# Patient Record
Sex: Male | Born: 1995
Health system: Southern US, Community
[De-identification: ages and names within clinical notes are randomized; demographics above are authoritative.]

## PROBLEM LIST (undated history)

## (undated) DIAGNOSIS — K219 Gastro-esophageal reflux disease without esophagitis: Secondary | ICD-10-CM

## (undated) HISTORY — PX: TYMPANOSTOMY TUBE PLACEMENT: SHX32

---
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
## 2017-08-19 DIAGNOSIS — R030 Elevated blood-pressure reading, without diagnosis of hypertension: Secondary | ICD-10-CM | POA: Diagnosis not present

## 2017-08-19 DIAGNOSIS — J301 Allergic rhinitis due to pollen: Secondary | ICD-10-CM | POA: Diagnosis not present

## 2017-08-19 DIAGNOSIS — K219 Gastro-esophageal reflux disease without esophagitis: Secondary | ICD-10-CM | POA: Diagnosis not present

## 2017-08-19 DIAGNOSIS — W99XXXA Exposure to other man-made environmental factors, initial encounter: Secondary | ICD-10-CM | POA: Diagnosis not present

## 2017-08-25 DIAGNOSIS — Z68.41 Body mass index (BMI) pediatric, 5th percentile to less than 85th percentile for age: Secondary | ICD-10-CM | POA: Diagnosis not present

## 2017-08-25 DIAGNOSIS — F411 Generalized anxiety disorder: Secondary | ICD-10-CM | POA: Diagnosis not present

## 2017-09-22 DIAGNOSIS — Z1389 Encounter for screening for other disorder: Secondary | ICD-10-CM | POA: Diagnosis not present

## 2017-09-22 DIAGNOSIS — F411 Generalized anxiety disorder: Secondary | ICD-10-CM | POA: Diagnosis not present

## 2017-09-22 DIAGNOSIS — M25512 Pain in left shoulder: Secondary | ICD-10-CM | POA: Diagnosis not present

## 2017-09-22 DIAGNOSIS — Z1331 Encounter for screening for depression: Secondary | ICD-10-CM | POA: Diagnosis not present

## 2018-01-14 DIAGNOSIS — J0101 Acute recurrent maxillary sinusitis: Secondary | ICD-10-CM | POA: Diagnosis not present

## 2018-01-14 DIAGNOSIS — F411 Generalized anxiety disorder: Secondary | ICD-10-CM | POA: Diagnosis not present

## 2018-01-15 MED FILL — busPIRone HCL 10 MG TABS: 10 | 90 days supply | Qty: 180 | Fill #0

## 2018-01-15 MED FILL — OMEPRAZOLE 40 MG CPDR: 40 | 90 days supply | Qty: 90 | Fill #0

## 2018-04-01 DIAGNOSIS — Z23 Encounter for immunization: Secondary | ICD-10-CM | POA: Diagnosis not present

## 2018-04-01 DIAGNOSIS — Z68.41 Body mass index (BMI) pediatric, 5th percentile to less than 85th percentile for age: Secondary | ICD-10-CM | POA: Diagnosis not present

## 2018-04-01 DIAGNOSIS — H8110 Benign paroxysmal vertigo, unspecified ear: Secondary | ICD-10-CM | POA: Diagnosis not present

## 2018-04-14 DIAGNOSIS — H52223 Regular astigmatism, bilateral: Secondary | ICD-10-CM | POA: Diagnosis not present

## 2018-05-03 DIAGNOSIS — H6980 Other specified disorders of Eustachian tube, unspecified ear: Secondary | ICD-10-CM | POA: Diagnosis not present

## 2018-05-03 DIAGNOSIS — H8110 Benign paroxysmal vertigo, unspecified ear: Secondary | ICD-10-CM | POA: Diagnosis not present

## 2018-06-10 DIAGNOSIS — R05 Cough: Secondary | ICD-10-CM | POA: Diagnosis not present

## 2018-06-10 DIAGNOSIS — J0100 Acute maxillary sinusitis, unspecified: Secondary | ICD-10-CM | POA: Diagnosis not present

## 2018-08-04 MED FILL — busPIRone HCL 10 MG TABS: 10 | 90 days supply | Qty: 180 | Fill #0

## 2018-08-10 DIAGNOSIS — J0100 Acute maxillary sinusitis, unspecified: Secondary | ICD-10-CM | POA: Diagnosis not present

## 2018-08-10 DIAGNOSIS — J309 Allergic rhinitis, unspecified: Secondary | ICD-10-CM | POA: Diagnosis not present

## 2019-05-18 DIAGNOSIS — Z6821 Body mass index (BMI) 21.0-21.9, adult: Secondary | ICD-10-CM | POA: Diagnosis not present

## 2019-05-18 DIAGNOSIS — H109 Unspecified conjunctivitis: Secondary | ICD-10-CM | POA: Diagnosis not present

## 2019-05-25 DIAGNOSIS — Z6821 Body mass index (BMI) 21.0-21.9, adult: Secondary | ICD-10-CM | POA: Diagnosis not present

## 2019-05-25 DIAGNOSIS — M7989 Other specified soft tissue disorders: Secondary | ICD-10-CM | POA: Diagnosis not present

## 2019-08-08 DIAGNOSIS — L700 Acne vulgaris: Secondary | ICD-10-CM | POA: Diagnosis not present

## 2019-08-08 DIAGNOSIS — L218 Other seborrheic dermatitis: Secondary | ICD-10-CM | POA: Diagnosis not present

## 2019-09-22 DIAGNOSIS — J029 Acute pharyngitis, unspecified: Secondary | ICD-10-CM | POA: Diagnosis not present

## 2019-09-22 DIAGNOSIS — Z20828 Contact with and (suspected) exposure to other viral communicable diseases: Secondary | ICD-10-CM | POA: Diagnosis not present

## 2019-09-22 DIAGNOSIS — J0101 Acute recurrent maxillary sinusitis: Secondary | ICD-10-CM | POA: Diagnosis not present

## 2021-02-26 ENCOUNTER — Other Ambulatory Visit: Payer: Self-pay | Admitting: Family Medicine

## 2021-02-26 ENCOUNTER — Ambulatory Visit
Admission: RE | Admit: 2021-02-26 | Discharge: 2021-02-26 | Disposition: A | Discharge status: Home / Self Care | Source: Ambulatory Visit | Attending: Family Medicine | Admitting: Family Medicine

## 2021-02-26 DIAGNOSIS — S6991XA Unspecified injury of right wrist, hand and finger(s), initial encounter: Secondary | ICD-10-CM

## 2022-01-06 DIAGNOSIS — Z20828 Contact with and (suspected) exposure to other viral communicable diseases: Secondary | ICD-10-CM | POA: Diagnosis not present

## 2022-01-06 DIAGNOSIS — Z6823 Body mass index (BMI) 23.0-23.9, adult: Secondary | ICD-10-CM | POA: Diagnosis not present

## 2022-01-06 DIAGNOSIS — R509 Fever, unspecified: Secondary | ICD-10-CM | POA: Diagnosis not present

## 2022-03-14 DIAGNOSIS — Z23 Encounter for immunization: Secondary | ICD-10-CM | POA: Diagnosis not present

## 2022-10-02 DIAGNOSIS — Z0000 Encounter for general adult medical examination without abnormal findings: Secondary | ICD-10-CM | POA: Diagnosis not present

## 2022-10-02 DIAGNOSIS — Z6823 Body mass index (BMI) 23.0-23.9, adult: Secondary | ICD-10-CM | POA: Diagnosis not present

## 2022-10-07 ENCOUNTER — Encounter (INDEPENDENT_AMBULATORY_CARE_PROVIDER_SITE_OTHER): Payer: Self-pay | Admitting: *Deleted

## 2022-10-22 ENCOUNTER — Encounter (INDEPENDENT_AMBULATORY_CARE_PROVIDER_SITE_OTHER): Payer: Self-pay | Admitting: *Deleted

## 2022-11-03 IMAGING — CR DG FINGER MIDDLE 2+V*R*
3 series · 3 of 3 positions shown · non-contrast
Comparison: No prior images retrievable at the time of this
dictation.

CLINICAL DATA: Injury of right middle finger, initial encounter

EXAM:
RIGHT MIDDLE FINGER 2+V

[x finger pa right]
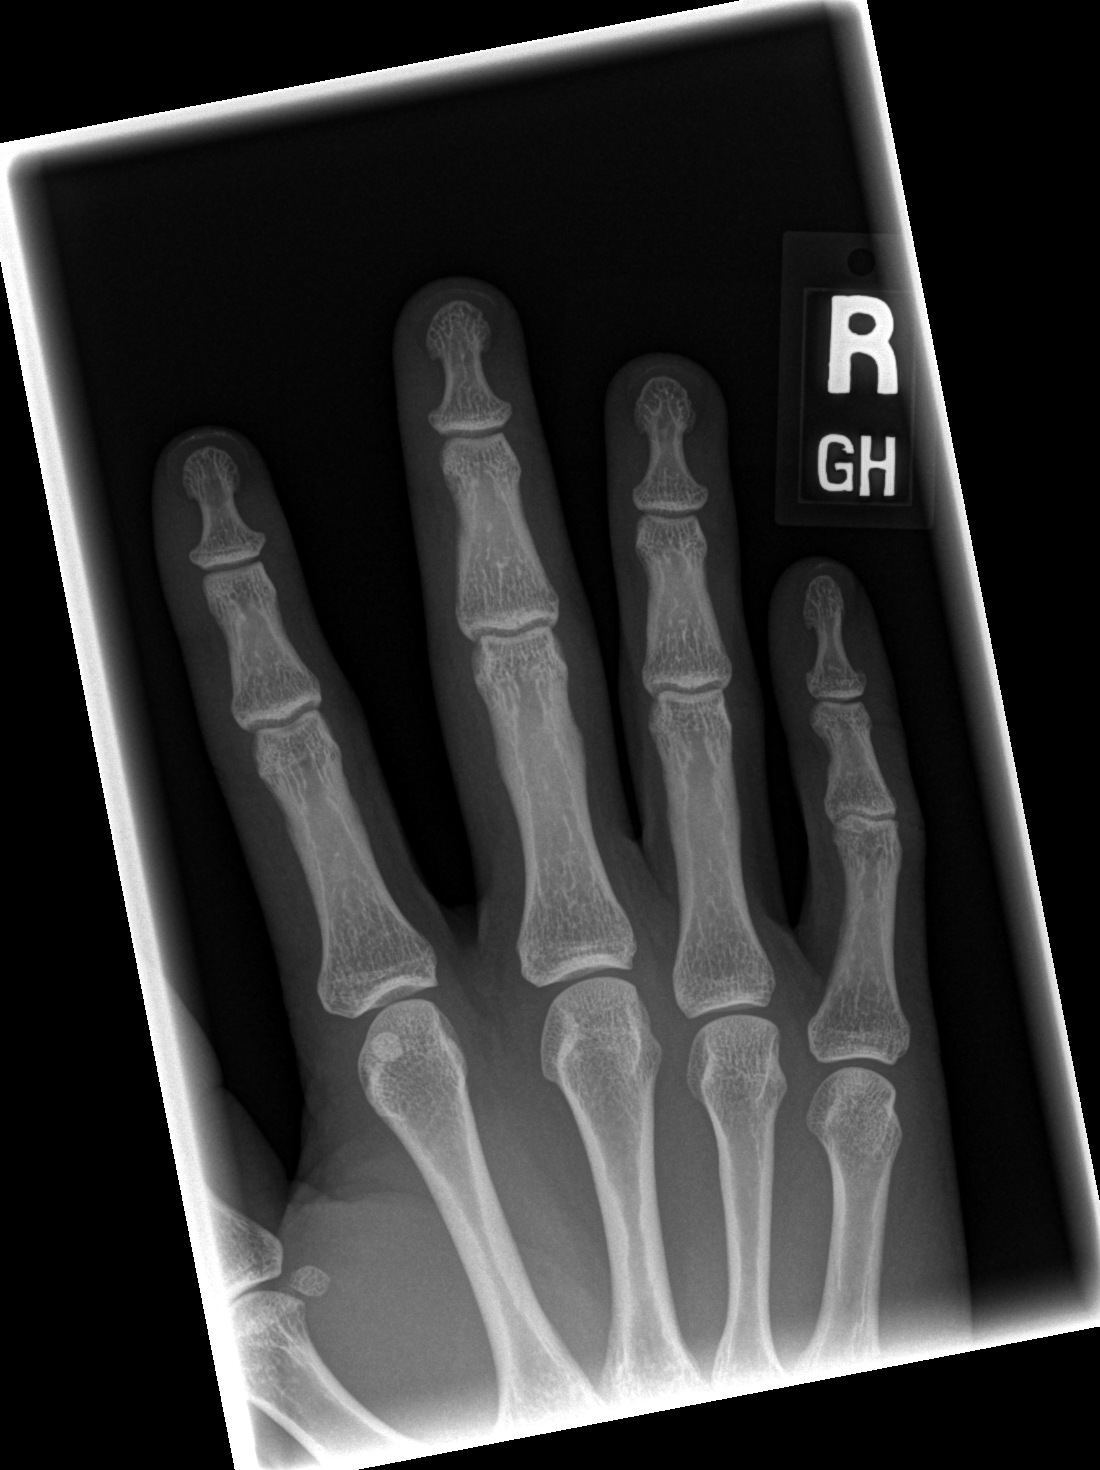

[x finger obl. right]
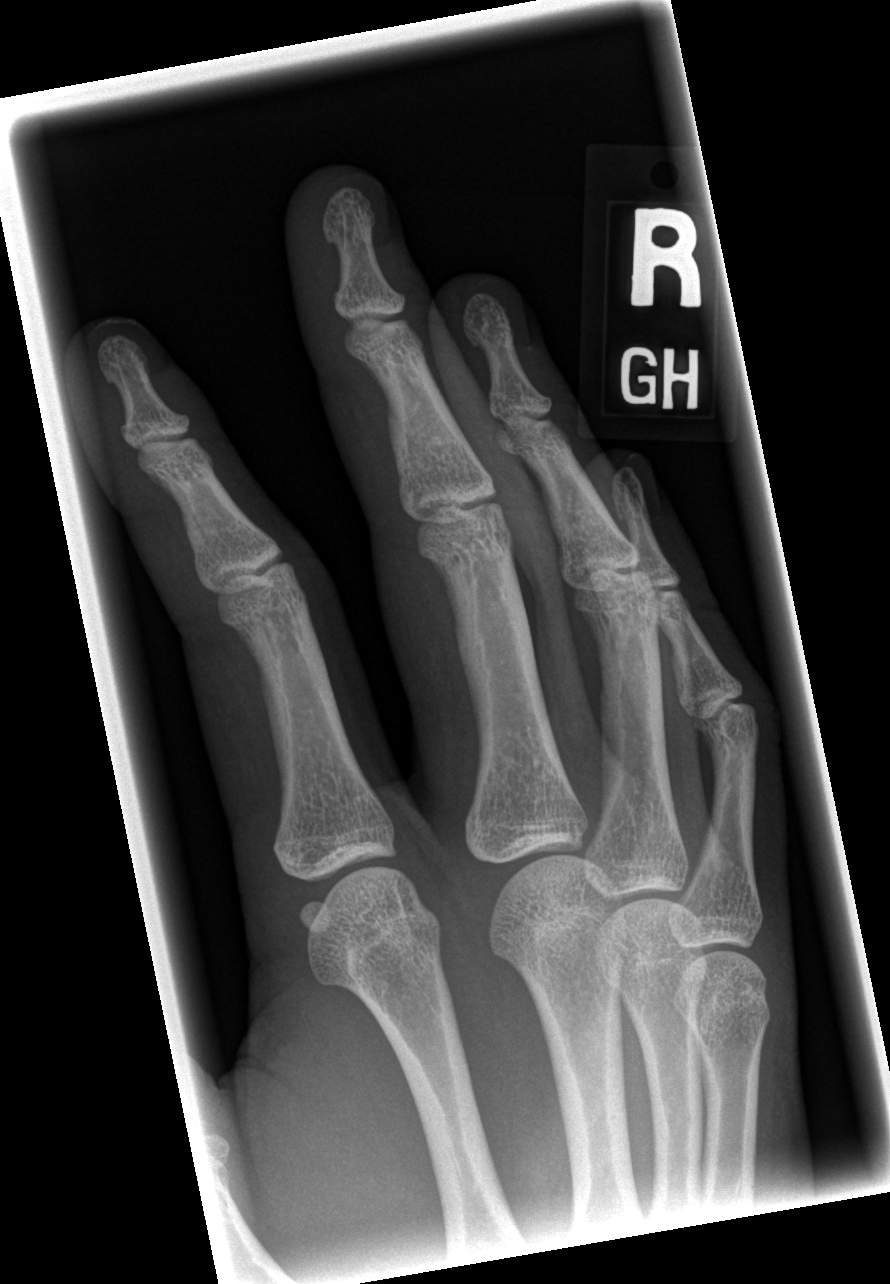

[x finger lateral right]
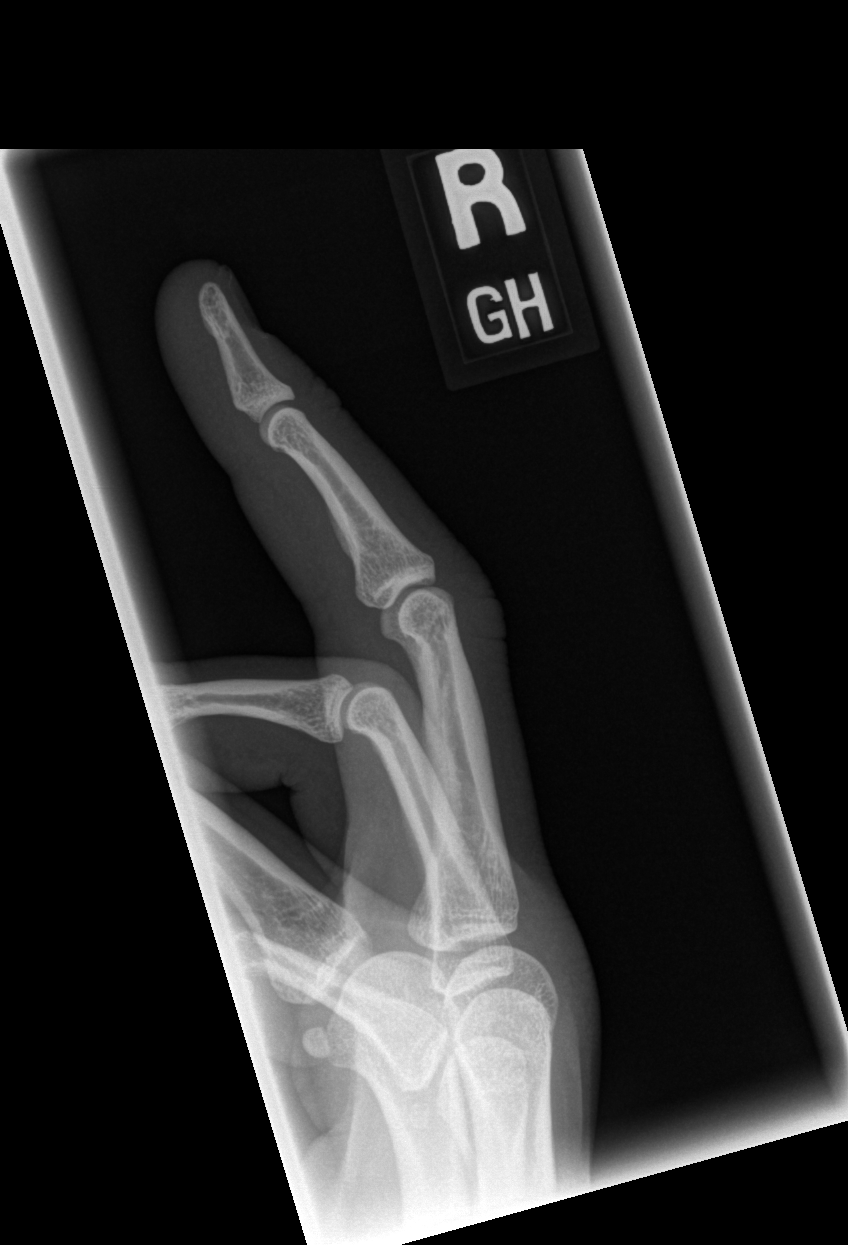

[3 of 3 positions shown; findings below may reference images not displayed]

FINDINGS: There is no evidence of acute fracture or dislocation. There is no
significant arthropathy. No radiopaque foreign body. There is mild
soft tissue swelling of the middle finger.
IMPRESSION: No acute osseous abnormality. Soft tissue swelling of the middle
finger.

## 2022-11-05 ENCOUNTER — Ambulatory Visit (INDEPENDENT_AMBULATORY_CARE_PROVIDER_SITE_OTHER): Payer: BC Managed Care – PPO | Admitting: Gastroenterology

## 2022-11-05 ENCOUNTER — Encounter (INDEPENDENT_AMBULATORY_CARE_PROVIDER_SITE_OTHER): Payer: Self-pay | Admitting: Gastroenterology

## 2022-11-05 VITALS — BP 123/71 | HR 78 | Temp 98.7°F | Ht 74.0 in | Wt 191.7 lb

## 2022-11-05 DIAGNOSIS — K219 Gastro-esophageal reflux disease without esophagitis: Secondary | ICD-10-CM | POA: Diagnosis not present

## 2022-11-05 MED ORDER — OMEPRAZOLE 40 MG PO CPDR: 40.0000 mg | DELAYED_RELEASE_CAPSULE | Freq: Every day | ORAL | 3 refills | Status: DC

## 2022-11-05 NOTE — Progress Notes (Signed)
Katrinka Blazing, M.D. Gastroenterology & Hepatology [facility] G[facility]enterology [address] [place], [place] [address] Primary Care Physician: Lawerance Sabal, [place] [address] [place] [address]  Referring MD: PCP  Chief Complaint:  Heartburn  History of Present Illness: Kendarrius Tanzi is a 27 y.o. male with no PMH, who presents for evaluation of heartburn.  Patient reports that for the last 3 months he has noticed recurrent episodes of heartburn. States he used to have GERD when younger but it resolved when he grew. He reports that he has noticed having heartburn after drinking soft drinks, or eating spicy food/tomatoes. Very occasionally he has heartburn without eating. He has presented frequented burping.  He recently (1 month ago) started taking OTC Prilosec, thinks he is taking 3 pills of the 20 mg every day. This controls his symptoms. He will have heartburn with lower dose.  No odynophagia or dysphagia.  The patient denies having any nausea, vomiting, fever, chills, hematochezia, melena, hematemesis, abdominal distention, abdominal pain, diarrhea, jaundice, pruritus or weight loss.  Last FAO:ZHYQM Last Colonoscopy:never  FHx: neg for any gastrointestinal/liver disease, grandfather colon cancer Social: neg smoking, alcohol or illicit drug use Surgical: no abdominal surgeries  Past Medical History:History reviewed. No pertinent past medical history.  Past Surgical History:History reviewed. No pertinent surgical history.  Family History: Family History  Problem Relation Age of Onset   Healthy Mother    Healthy Father     Social History: Social History   Tobacco Use  Smoking Status Never  Smokeless Tobacco Never   Social History   Substance and Sexual Activity  Alcohol Use Never   Social History   Substance and Sexual Activity  Drug Use Never    Allergies: Not on File  Medications: No current outpatient medications on file.    No current facility-administered medications for this visit.    Review of Systems: GENERAL: negative for malaise, night sweats HEENT: No changes in hearing or vision, no nose bleeds or other nasal problems. NECK: Negative for lumps, goiter, pain and significant neck swelling RESPIRATORY: Negative for cough, wheezing CARDIOVASCULAR: Negative for chest pain, leg swelling, palpitations, orthopnea GI: SEE HPI MUSCULOSKELETAL: Negative for joint pain or swelling, back pain, and muscle pain. SKIN: Negative for lesions, rash PSYCH: Negative for sleep disturbance, mood disorder and recent psychosocial stressors. HEMATOLOGY Negative for prolonged bleeding, bruising easily, and swollen nodes. ENDOCRINE: Negative for cold or heat intolerance, polyuria, polydipsia and goiter. NEURO: negative for tremor, gait imbalance, syncope and seizures. The remainder of the review of systems is noncontributory.   Physical Exam: BP 123/71 (BP Location: Left Arm, Patient Position: Sitting, Cuff Size: Normal)   Pulse 78   Temp 98.7 F (37.1 C) (Temporal)   Ht 6\' 2"  (1.88 m)   Wt 191 lb 11.2 oz (87 kg)   BMI 24.61 kg/m  GENERAL: The patient is AO x3, in no acute distress. HEENT: Head is normocephalic and atraumatic. EOMI are intact. Mouth is well hydrated and without lesions. NECK: Supple. No masses LUNGS: Clear to auscultation. No presence of rhonchi/wheezing/rales. Adequate chest expansion HEART: RRR, normal s1 and s2. ABDOMEN: Soft, nontender, no guarding, no peritoneal signs, and nondistended. BS +. No masses. EXTREMITIES: Without any cyanosis, clubbing, rash, lesions or edema. NEUROLOGIC: AOx3, no focal motor deficit. SKIN: no jaundice, no rashes   Imaging/Labs: as above  I personally reviewed and interpreted the available labs, imaging and endoscopic files.  Impression and Plan: Mariel Gaudin is a 27 y.o. male  with no PMH, who presents for evaluation of heartburn.  The patient has presented  very typical symptoms of GERD without red flag signs.  He has responded to very high doses of PPI.  We discussed that ideally he should take a max of 40 mg per dose but he should change the timing when he consumes the PPI.  He understood this.  If he is still presenting significant heartburn with once a day dosing, he can take from rescue doses of Pepcid.  With this is that if his symptoms were to persist he will need to proceed with an EGD to further evaluate this.  We also briefly discussed the possibility of TIF as an option to treat refractory reflux or to avoid taking the medication long-term.  - Start omeprazole 40 mg qday -Explained presumed etiology of reflux symptoms. Instruction provided in the use of antireflux medication - patient should take medication in the morning 45 minutes before eating breakfast. -Discussed avoidance of eating within 2 hours of lying down to sleep and benefit of blocks to elevate head of bed. Also, will benefit from avoiding carbonated drinks/sodas or food that has tomatoes, spicy or greasy food. -If not improving with medication after a couple of weeks, can take Pepcid 20 mg at bedtime -The patient and I held a thorough discussion about potential nonpharmacologic treatments for reflux such as transoral Incisionless Fundoplication (TIF).  The details of the procedure, benefits and risks, as well as prognosis with this intervention was thoroughly discussed with the patient who understood and agreed.   The patient will read more about this procedure  All questions were answered.      Katrinka Blazing, MD Gastroenterology and Hepatology [facility]

## 2022-11-05 NOTE — Patient Instructions (Signed)
Start omeprazole 40 mg qday Explained presumed etiology of reflux symptoms. Instruction provided in the use of antireflux medication - patient should take medication in the morning 45 minutes before eating breakfast. Discussed avoidance of eating within 2 hours of lying down to sleep and benefit of blocks to elevate head of bed. Also, will benefit from avoiding carbonated drinks/sodas or food that has tomatoes, spicy or greasy food. If not improving with medication after a couple of weeks, can take Pepcid 20 mg at bedtime The patient and I held a thorough discussion about potential nonpharmacologic treatments for reflux such as transoral Incisionless Fundoplication (TIF).  The details of the procedure, benefits and risks, as well as prognosis with this intervention was thoroughly discussed with the patient who understood and agreed.  Dietary modifications and post procedural recommendations were also discussed the patient.  The patient will read more about this procedure

## 2022-11-07 DIAGNOSIS — K219 Gastro-esophageal reflux disease without esophagitis: Secondary | ICD-10-CM | POA: Diagnosis not present

## 2022-11-07 DIAGNOSIS — Z0000 Encounter for general adult medical examination without abnormal findings: Secondary | ICD-10-CM | POA: Diagnosis not present

## 2022-11-07 DIAGNOSIS — Z13220 Encounter for screening for lipoid disorders: Secondary | ICD-10-CM | POA: Diagnosis not present

## 2022-12-04 ENCOUNTER — Ambulatory Visit (INDEPENDENT_AMBULATORY_CARE_PROVIDER_SITE_OTHER): Payer: BC Managed Care – PPO | Admitting: Gastroenterology

## 2023-01-26 ENCOUNTER — Ambulatory Visit (INDEPENDENT_AMBULATORY_CARE_PROVIDER_SITE_OTHER): Payer: BC Managed Care – PPO | Admitting: Gastroenterology

## 2023-03-16 ENCOUNTER — Encounter (INDEPENDENT_AMBULATORY_CARE_PROVIDER_SITE_OTHER): Payer: Self-pay | Admitting: Gastroenterology

## 2023-03-16 ENCOUNTER — Ambulatory Visit (INDEPENDENT_AMBULATORY_CARE_PROVIDER_SITE_OTHER): Payer: BC Managed Care – PPO | Admitting: Gastroenterology

## 2023-03-16 VITALS — BP 128/69 | HR 72 | Temp 98.2°F | Ht 74.0 in | Wt 186.6 lb

## 2023-03-16 DIAGNOSIS — K219 Gastro-esophageal reflux disease without esophagitis: Secondary | ICD-10-CM | POA: Diagnosis not present

## 2023-03-16 NOTE — Progress Notes (Signed)
Referring Provider: Lawerance Sabal, PA Primary Care Physician:  Lawerance Sabal, [place] Primary GI Physician: Dr. Levon Hedger   Chief Complaint  Patient presents with   Gastroesophageal Reflux    Follow up on GERD. Taking omeprazole once daily and states it is helping a lot.    HPI:   Luis Steele is a 27 y.o. male with history of GERD   Patient presenting today for follow up of GERD   Last seen June 2024, at that time reported for the last 3 months having recurrent episodes of heartburn.  Reported history of GERD as a young child that he outgrew.  Also with frequent belching.  Had recently started over-the-counter Prilosec taking 3 pills of the 20 mg Prilosec every day.  Patient recommended to start omeprazole 40 mg daily, good reflux precautions, add Pepcid 20 mg at bedtime if symptoms not improving over the next couple of weeks, also given information about TIF procedure.  Present: Doing well today. No GI complaints. No issues with GERD symptoms since starting omeprazole 40mg  daily, he is feeling much better on this. No red flag symptoms. Patient denies melena, hematochezia, nausea, vomiting, diarrhea, constipation, dysphagia, odyonophagia, early satiety or weight loss.    Last Colonoscopy: never  Last Endoscopy: never  Recommendations:    Current Outpatient Medications  Medication Sig Dispense Refill   omeprazole (PRILOSEC) 40 MG capsule Take 1 capsule (40 mg total) by mouth daily. 90 capsule 3   No current facility-administered medications for this visit.   Allergies as of 03/16/2023   (No Known Allergies)   Family History  Problem Relation Age of Onset   Healthy Mother    Healthy Father     Social History   Socioeconomic History   Marital status: Single    Spouse name: Not on file   Number of children: Not on file   Years of education: Not on file   Highest education level: Not on file  Occupational History   Not on file  Tobacco Use   Smoking status: Never    Smokeless tobacco: Never  Vaping Use   Vaping status: Never Used  Substance and Sexual Activity   Alcohol use: Never   Drug use: Never   Sexual activity: Not on file  Other Topics Concern   Not on file  Social History Narrative   Not on file   Social Determinants of Health   Financial Resource Strain: Not on file  Food Insecurity: Not on file  Transportation Needs: Not on file  Physical Activity: Not on file  Stress: Not on file  Social Connections: Not on file    Review of systems General: negative for malaise, night sweats, fever, chills, weight los Neck: Negative for lumps, goiter, pain and significant neck swelling Resp: Negative for cough, wheezing, dyspnea at rest CV: Negative for chest pain, leg swelling, palpitations, orthopnea GI: denies melena, hematochezia, nausea, vomiting, diarrhea, constipation, dysphagia, odyonophagia, early satiety or unintentional weight loss.  The remainder of the review of systems is noncontributory.  Physical Exam: BP 128/69 (BP Location: Left Arm, Patient Position: Sitting, Cuff Size: Normal)   Pulse 72   Temp 98.2 F (36.8 C) (Oral)   Ht 6\' 2"  (1.88 m)   Wt 186 lb 9.6 oz (84.6 kg)   BMI 23.96 kg/m  General:   Alert and oriented. No distress noted. Pleasant and cooperative.  Head:  Normocephalic and atraumatic. Eyes:  Conjuctiva clear without scleral icterus. Mouth:  Oral mucosa pink and moist. Good dentition.  No lesions. Heart: Normal rate and rhythm, s1 and s2 heart sounds present.  Lungs: Clear lung sounds in all lobes. Respirations equal and unlabored. Abdomen:  +BS, soft, non-tender and non-distended. No rebound or guarding. No HSM or masses noted. Neurologic:  Alert and  oriented x4 Psych:  Alert and cooperative. Normal mood and affect.  Invalid input(s): "6 MONTHS"   ASSESSMENT: Luis Steele is a 27 y.o. male presenting today for follow up of GERD  GERD well-managed on omeprazole 40 mg once daily.  He denies any  breakthrough symptoms.  No dysphagia or odynophagia.  He has no GI complaints today.  Will continue with omeprazole 40 mg once daily, good reflux precautions to include avoiding greasy, spicy, tomato/citrus based foods, caffeine, chocolate, alcohol, staying upright 2 to 3 hours after eating prior to laying down.   PLAN:  Continue with omeprazole 40mg  daily   2. Good reflux precautions   All questions were answered, patient verbalized understanding and is in agreement with plan as outlined above.   Follow Up: 1 year   Luis Ricci L. Jeanmarie Hubert, MSN, APRN, AGNP-C Adult-Gerontology Nurse Practitioner [facility]  I have reviewed the note and agree with the APP's assessment as described in this progress note  Katrinka Blazing, MD Gastroenterology and Hepatology [facility]

## 2023-03-16 NOTE — Patient Instructions (Signed)
Please continue with omeprazole 40mg  once daily Be mindful of greasy, spicy, fried, citrus foods, caffeine, carbonated drinks, chocolate and alcohol as these can increase reflux symptoms Stay upright 2-3 hours after eating, prior to lying down and avoid eating late in the evenings.  Follow up 1 year or sooner for new or worsening GI issues  It was a pleasure to see you today. I want to create trusting relationships with patients and provide genuine, compassionate, and quality care. I truly value your feedback! please be on the lookout for a survey regarding your visit with me today. I appreciate your input about our visit and your time in completing this!    Beaulah Romanek L. Jeanmarie Hubert, MSN, APRN, AGNP-C Adult-Gerontology Nurse Practitioner [facility] at [facility]

## 2023-06-12 DIAGNOSIS — J0100 Acute maxillary sinusitis, unspecified: Secondary | ICD-10-CM | POA: Diagnosis not present

## 2023-06-12 DIAGNOSIS — Z6824 Body mass index (BMI) 24.0-24.9, adult: Secondary | ICD-10-CM | POA: Diagnosis not present

## 2023-06-12 DIAGNOSIS — J069 Acute upper respiratory infection, unspecified: Secondary | ICD-10-CM | POA: Diagnosis not present

## 2023-06-12 DIAGNOSIS — Z20828 Contact with and (suspected) exposure to other viral communicable diseases: Secondary | ICD-10-CM | POA: Diagnosis not present

## 2023-06-12 DIAGNOSIS — R509 Fever, unspecified: Secondary | ICD-10-CM | POA: Diagnosis not present

## 2023-09-09 ENCOUNTER — Other Ambulatory Visit (INDEPENDENT_AMBULATORY_CARE_PROVIDER_SITE_OTHER): Payer: Self-pay | Admitting: Gastroenterology

## 2023-09-09 DIAGNOSIS — K649 Unspecified hemorrhoids: Secondary | ICD-10-CM

## 2023-09-09 MED ORDER — HYDROCORTISONE ACETATE 25 MG RE SUPP: 25.0000 mg | Freq: Two times a day (BID) | RECTAL | 0 refills | Status: DC

## 2023-09-09 NOTE — Progress Notes (Signed)
 Patient with new onset recent rectal bleeding, will send Anusol suppositories

## 2023-12-31 ENCOUNTER — Encounter (INDEPENDENT_AMBULATORY_CARE_PROVIDER_SITE_OTHER): Payer: Self-pay | Admitting: Gastroenterology

## 2023-12-31 ENCOUNTER — Ambulatory Visit (INDEPENDENT_AMBULATORY_CARE_PROVIDER_SITE_OTHER): Admitting: Gastroenterology

## 2023-12-31 VITALS — BP 124/69 | HR 83 | Temp 98.9°F | Ht 74.0 in | Wt 183.3 lb

## 2023-12-31 DIAGNOSIS — R197 Diarrhea, unspecified: Secondary | ICD-10-CM | POA: Insufficient documentation

## 2023-12-31 DIAGNOSIS — K219 Gastro-esophageal reflux disease without esophagitis: Secondary | ICD-10-CM | POA: Diagnosis not present

## 2023-12-31 DIAGNOSIS — R194 Change in bowel habit: Secondary | ICD-10-CM

## 2023-12-31 DIAGNOSIS — K625 Hemorrhage of anus and rectum: Secondary | ICD-10-CM | POA: Diagnosis not present

## 2023-12-31 DIAGNOSIS — K580 Irritable bowel syndrome with diarrhea: Secondary | ICD-10-CM | POA: Insufficient documentation

## 2023-12-31 NOTE — H&P (View-Only) (Signed)
 Toribio Fortune, M.D. Gastroenterology & Hepatology [facility] Gastroenterology [address] [place], [place] [address]  Primary Care Physician: Job Bolt, [place] [address] [place] [address]  I will communicate my assessment and recommendations to the referring MD via EMR.  Problems: Diarrhea GERD  History of Present Illness: Luis Steele is a 28 y.o. male with past medical history of GERD, who presents for follow up of GERD and diarrhea.  The patient was last seen on 03/16/2023. At that time, the patient was continued on omeprazole  40 mg daily.  Patient complained of having rectal bleeding in April and was given a Anusol  course.  Patient reports that for the last 3 months he has presented episodes of postprandial diarrhea. Consistency can vary, can be soft to watery. He is having 3-4 Bms per day, usually after having a meal. Has only noticed coffee causes increased BM frequency. No other identified food. He has seen scant amount of blood when he wipes, but is not often. He may have some pain episodes in the epigastric area, but this is not often.   The patient denies having any nausea, vomiting, fever, chills, melena, hematemesis, abdominal distention, jaundice, pruritus or weight loss.  He is taking omeprazole  40 mg daily, no heartburn.  Last EGD: Never Last Colonoscopy: Never  Past Medical History:History reviewed. No pertinent past medical history.  Past Surgical History:History reviewed. No pertinent surgical history.  Family History: Family History  Problem Relation Age of Onset   Healthy Mother    Healthy Father     Social History: Social History   Tobacco Use  Smoking Status Never  Smokeless Tobacco Never   Social History   Substance and Sexual Activity  Alcohol  Use Never   Social History   Substance and Sexual Activity  Drug Use Never    Allergies: No Known Allergies  Medications: Current Outpatient Medications   Medication Sig Dispense Refill   omeprazole  (PRILOSEC) 40 MG capsule Take 1 capsule (40 mg total) by mouth daily. 90 capsule 3   Peppermint Oil (IBGARD PO) Take by mouth daily at 6 (six) AM.     hydrocortisone  (ANUSOL -HC) 25 MG suppository Place 1 suppository (25 mg total) rectally 2 (two) times daily. (Patient not taking: Reported on 12/31/2023) 14 suppository 0   No current facility-administered medications for this visit.    Review of Systems: GENERAL: negative for malaise, night sweats HEENT: No changes in hearing or vision, no nose bleeds or other nasal problems. NECK: Negative for lumps, goiter, pain and significant neck swelling RESPIRATORY: Negative for cough, wheezing CARDIOVASCULAR: Negative for chest pain, leg swelling, palpitations, orthopnea GI: SEE HPI MUSCULOSKELETAL: Negative for joint pain or swelling, back pain, and muscle pain. SKIN: Negative for lesions, rash PSYCH: Negative for sleep disturbance, mood disorder and recent psychosocial stressors. HEMATOLOGY Negative for prolonged bleeding, bruising easily, and swollen nodes. ENDOCRINE: Negative for cold or heat intolerance, polyuria, polydipsia and goiter. NEURO: negative for tremor, gait imbalance, syncope and seizures. The remainder of the review of systems is noncontributory.   Physical Exam: BP 124/69 (BP Location: Left Arm, Patient Position: Sitting, Cuff Size: Normal)   Pulse 83   Temp 98.9 F (37.2 C) (Temporal)   Ht 6' 2 (1.88 m)   Wt 183 lb 4.8 oz (83.1 kg)   BMI 23.53 kg/m  GENERAL: The patient is AO x3, in no acute distress. HEENT: Head is normocephalic and atraumatic. EOMI are intact. Mouth is well hydrated and without lesions. NECK:  Supple. No masses LUNGS: Clear to auscultation. No presence of rhonchi/wheezing/rales. Adequate chest expansion HEART: RRR, normal s1 and s2. ABDOMEN: Soft, nontender, no guarding, no peritoneal signs, and nondistended. BS +. No masses. EXTREMITIES: Without any  cyanosis, clubbing, rash, lesions or edema. NEUROLOGIC: AOx3, no focal motor deficit. SKIN: no jaundice, no rashes  Imaging/Labs: as above  I personally reviewed and interpreted the available labs, imaging and endoscopic files.  Impression and Plan: History of Present Illness: Luis Steele is a 28 y.o. male with past medical history of GERD, who presents for follow up of GERD and diarrhea.  Patient has presented new onset of episodes of postprandial diarrhea with very occasional rectal bleeding, especially when he wipes.  No other red flag signs.  We discussed the importance of performing further testing to rule out infectious etiologies and autoimmune disorders.  He is in agreement to proceed with this.  May consider starting antidiarrheals once testing is back.  He may also have a component of bowel hypersensitivity leading to his current symptoms, for which he will benefit from implementing a low FODMAP diet.  Ultimately, if his symptoms persist, he will need to have a colonoscopy.  GERD appears to be well-controlled with omeprazole  40 mg daily.  Will continue this dosage for now but will decrease to 20 mg dosing once he runs out of his current prescription.  -Check CMP celiac disease panel and TSH - Check fecal calprotectin, C. Diff, GI path and ova and parasite in stool -Try to implement a low FODMAP diet -Avoid antidiarrheals for now until stool testing is back -Continue omeprazole  40 mg daily for now, once he is running out of medication he should call to our office to decrease dosing to 20 mg daily  All questions were answered.      Toribio Fortune, MD Gastroenterology and Hepatology [facility]enterology

## 2023-12-31 NOTE — Progress Notes (Signed)
 Toribio Fortune, M.D. Gastroenterology & Hepatology [facility] Gastroenterology [address] [place], [place] [address]  Primary Care Physician: Job Bolt, [place] [address] [place] [address]  I will communicate my assessment and recommendations to the referring MD via EMR.  Problems: Diarrhea GERD  History of Present Illness: Luis Steele is a 28 y.o. male with past medical history of GERD, who presents for follow up of GERD and diarrhea.  The patient was last seen on 03/16/2023. At that time, the patient was continued on omeprazole  40 mg daily.  Patient complained of having rectal bleeding in April and was given a Anusol  course.  Patient reports that for the last 3 months he has presented episodes of postprandial diarrhea. Consistency can vary, can be soft to watery. He is having 3-4 Bms per day, usually after having a meal. Has only noticed coffee causes increased BM frequency. No other identified food. He has seen scant amount of blood when he wipes, but is not often. He may have some pain episodes in the epigastric area, but this is not often.   The patient denies having any nausea, vomiting, fever, chills, melena, hematemesis, abdominal distention, jaundice, pruritus or weight loss.  He is taking omeprazole  40 mg daily, no heartburn.  Last EGD: Never Last Colonoscopy: Never  Past Medical History:History reviewed. No pertinent past medical history.  Past Surgical History:History reviewed. No pertinent surgical history.  Family History: Family History  Problem Relation Age of Onset   Healthy Mother    Healthy Father     Social History: Social History   Tobacco Use  Smoking Status Never  Smokeless Tobacco Never   Social History   Substance and Sexual Activity  Alcohol  Use Never   Social History   Substance and Sexual Activity  Drug Use Never    Allergies: No Known Allergies  Medications: Current Outpatient Medications   Medication Sig Dispense Refill   omeprazole  (PRILOSEC) 40 MG capsule Take 1 capsule (40 mg total) by mouth daily. 90 capsule 3   Peppermint Oil (IBGARD PO) Take by mouth daily at 6 (six) AM.     hydrocortisone  (ANUSOL -HC) 25 MG suppository Place 1 suppository (25 mg total) rectally 2 (two) times daily. (Patient not taking: Reported on 12/31/2023) 14 suppository 0   No current facility-administered medications for this visit.    Review of Systems: GENERAL: negative for malaise, night sweats HEENT: No changes in hearing or vision, no nose bleeds or other nasal problems. NECK: Negative for lumps, goiter, pain and significant neck swelling RESPIRATORY: Negative for cough, wheezing CARDIOVASCULAR: Negative for chest pain, leg swelling, palpitations, orthopnea GI: SEE HPI MUSCULOSKELETAL: Negative for joint pain or swelling, back pain, and muscle pain. SKIN: Negative for lesions, rash PSYCH: Negative for sleep disturbance, mood disorder and recent psychosocial stressors. HEMATOLOGY Negative for prolonged bleeding, bruising easily, and swollen nodes. ENDOCRINE: Negative for cold or heat intolerance, polyuria, polydipsia and goiter. NEURO: negative for tremor, gait imbalance, syncope and seizures. The remainder of the review of systems is noncontributory.   Physical Exam: BP 124/69 (BP Location: Left Arm, Patient Position: Sitting, Cuff Size: Normal)   Pulse 83   Temp 98.9 F (37.2 C) (Temporal)   Ht 6' 2 (1.88 m)   Wt 183 lb 4.8 oz (83.1 kg)   BMI 23.53 kg/m  GENERAL: The patient is AO x3, in no acute distress. HEENT: Head is normocephalic and atraumatic. EOMI are intact. Mouth is well hydrated and without lesions. NECK:  Supple. No masses LUNGS: Clear to auscultation. No presence of rhonchi/wheezing/rales. Adequate chest expansion HEART: RRR, normal s1 and s2. ABDOMEN: Soft, nontender, no guarding, no peritoneal signs, and nondistended. BS +. No masses. EXTREMITIES: Without any  cyanosis, clubbing, rash, lesions or edema. NEUROLOGIC: AOx3, no focal motor deficit. SKIN: no jaundice, no rashes  Imaging/Labs: as above  I personally reviewed and interpreted the available labs, imaging and endoscopic files.  Impression and Plan: History of Present Illness: Luis Steele is a 28 y.o. male with past medical history of GERD, who presents for follow up of GERD and diarrhea.  Patient has presented new onset of episodes of postprandial diarrhea with very occasional rectal bleeding, especially when he wipes.  No other red flag signs.  We discussed the importance of performing further testing to rule out infectious etiologies and autoimmune disorders.  He is in agreement to proceed with this.  May consider starting antidiarrheals once testing is back.  He may also have a component of bowel hypersensitivity leading to his current symptoms, for which he will benefit from implementing a low FODMAP diet.  Ultimately, if his symptoms persist, he will need to have a colonoscopy.  GERD appears to be well-controlled with omeprazole  40 mg daily.  Will continue this dosage for now but will decrease to 20 mg dosing once he runs out of his current prescription.  -Check CMP celiac disease panel and TSH - Check fecal calprotectin, C. Diff, GI path and ova and parasite in stool -Try to implement a low FODMAP diet -Avoid antidiarrheals for now until stool testing is back -Continue omeprazole  40 mg daily for now, once he is running out of medication he should call to our office to decrease dosing to 20 mg daily  All questions were answered.      Toribio Fortune, MD Gastroenterology and Hepatology [facility] Gastroenterology

## 2023-12-31 NOTE — Patient Instructions (Addendum)
 Perform blood and stool workup Try to implement a low FODMAP diet Avoid antidiarrheals for now until stool testing is back Continue omeprazole  40 mg daily for now, once you are running out of your medication please call to our office to decrease dosing to 20 mg daily

## 2024-01-01 DIAGNOSIS — R197 Diarrhea, unspecified: Secondary | ICD-10-CM | POA: Diagnosis not present

## 2024-01-02 LAB — COMPREHENSIVE METABOLIC PANEL WITH GFR
AG Ratio: 2 (calc) (ref 1.0–2.5)
ALT: 14 U/L (ref 9–46)
AST: 14 U/L (ref 10–40)
Albumin: 4.7 g/dL (ref 3.6–5.1)
Alkaline phosphatase (APISO): 58 U/L (ref 36–130)
BUN: 13 mg/dL (ref 7–25)
CO2: 25 mmol/L (ref 20–32)
Calcium: 9.7 mg/dL (ref 8.6–10.3)
Chloride: 107 mmol/L (ref 98–110)
Creat: 1.06 mg/dL (ref 0.60–1.24)
Globulin: 2.4 g/dL (ref 1.9–3.7)
Glucose, Bld: 88 mg/dL (ref 65–99)
Potassium: 5 mmol/L (ref 3.5–5.3)
Sodium: 141 mmol/L (ref 135–146)
Total Bilirubin: 0.4 mg/dL (ref 0.2–1.2)
Total Protein: 7.1 g/dL (ref 6.1–8.1)
eGFR: 99 mL/min/1.73m2 (ref >=60)

## 2024-01-02 LAB — C-REACTIVE PROTEIN: CRP: <3 mg/L (ref <8.0)

## 2024-01-02 LAB — CELIAC DISEASE PANEL
(tTG) Ab, IgA: <1 U/mL
(tTG) Ab, IgG: <1 U/mL
Gliadin IgA: <1 U/mL
Gliadin IgG: 11.9 U/mL
Immunoglobulin A: 141 mg/dL (ref 47–310)

## 2024-01-02 LAB — TSH: TSH: 1.87 m[IU]/L (ref 0.40–4.50)

## 2024-01-07 LAB — GASTROINTESTINAL PATHOGEN PNL
CampyloBacter Group: NOT DETECTED
Norovirus GI/GII: NOT DETECTED
Rotavirus A: NOT DETECTED
Salmonella species: NOT DETECTED
Shiga Toxin 1: NOT DETECTED
Shiga Toxin 2: NOT DETECTED
Shigella Species: NOT DETECTED
Vibrio Group: NOT DETECTED
Yersinia enterocolitica: NOT DETECTED

## 2024-01-07 LAB — OVA AND PARASITE EXAMINATION
CONCENTRATE RESULT:: NONE SEEN
MICRO NUMBER:: 16872207
SPECIMEN QUALITY:: ADEQUATE
TRICHROME RESULT:: NONE SEEN

## 2024-01-07 LAB — CALPROTECTIN: Calprotectin: 35 ug/g

## 2024-01-08 ENCOUNTER — Ambulatory Visit: Payer: Self-pay | Admitting: Gastroenterology

## 2024-01-13 ENCOUNTER — Encounter: Payer: Self-pay | Admitting: *Deleted

## 2024-01-13 ENCOUNTER — Other Ambulatory Visit: Payer: Self-pay | Admitting: *Deleted

## 2024-01-13 MED ORDER — PEG 3350-KCL-NA BICARB-NACL 420 G PO SOLR: 4000.0000 mL | Freq: Once | ORAL | 0 refills | Status: AC

## 2024-01-18 ENCOUNTER — Other Ambulatory Visit: Payer: Self-pay

## 2024-01-18 ENCOUNTER — Encounter (HOSPITAL_COMMUNITY): Payer: Self-pay

## 2024-01-27 ENCOUNTER — Encounter (HOSPITAL_COMMUNITY)
Admission: RE | Admit: 2024-01-27 | Discharge: 2024-01-27 | Disposition: A | Discharge status: Home / Self Care | Source: Ambulatory Visit | Attending: Gastroenterology | Admitting: Gastroenterology

## 2024-01-27 ENCOUNTER — Encounter (HOSPITAL_COMMUNITY): Payer: Self-pay

## 2024-01-27 HISTORY — DX: Gastro-esophageal reflux disease without esophagitis: K21.9

## 2024-01-27 NOTE — Patient Instructions (Signed)
 Luis Steele  01/27/2024     @PREFPERIOPPHARMACY @   Your procedure is scheduled on 01/29/2024.   Report to Luis Steele at 6:45 A.M.   Call this number if you have problems the morning of surgery:   [phone]  If you experience any cold or flu symptoms such as cough, fever, chills, shortness of breath, etc. between now and your scheduled surgery, please notify us  at the above number.   Remember:   Do not eat or drink after midnight.       Take these medicines the morning of surgery with A SIP OF WATER : Omeprazole     Do not wear jewelry, make-up or nail polish, including gel polish,  artificial nails, or any other type of covering on natural nails (fingers and  toes).  Do not wear lotions, powders, or perfumes, or deodorant.  Do not shave 48 hours prior to surgery.  Men may shave face and neck.  Do not bring valuables to the hospital.  [facility] is not responsible for any belongings or valuables.  Contacts, dentures or bridgework may not be worn into surgery.  Leave your suitcase in the car.  After surgery it may be brought to your room.  For patients admitted to the hospital, discharge time will be determined by your treatment team.  Patients discharged the day of surgery will not be allowed to drive home.   Name and phone number of your driver:   Family  Special instructions:  N/A  Please read over the following fact sheets that you were given. Care and Recovery After Surgery   Colonoscopy, Adult A colonoscopy is a procedure to look at the entire large intestine. This procedure is done using a long, thin, flexible tube that has a camera on the end. You may have a colonoscopy: As a part of normal colorectal screening. If you have certain symptoms, such as: A low number of red blood cells in your blood (anemia). Diarrhea that does not go away. Pain in your abdomen. Blood in your stool. A colonoscopy can help screen for and diagnose medical problems,  including: An abnormal growth of cells or tissue (tumor). Abnormal growths within the lining of your intestine (polyps). Inflammation. Areas of bleeding. Tell your health care provider about: Any allergies you have. All medicines you are taking, including vitamins, herbs, eye drops, creams, and over-the-counter medicines. Any problems you or family members have had with anesthetic medicines. Any bleeding problems you have. Any surgeries you have had. Any medical conditions you have. Any problems you have had with having bowel movements. Whether you are pregnant or may be pregnant. What are the risks? Generally, this is a safe procedure. However, problems may occur, including: Bleeding. Damage to your intestine. Allergic reactions to medicines given during the procedure. Infection. This is rare. What happens before the procedure? Eating and drinking restrictions Follow instructions from your health care provider about eating or drinking restrictions, which may include: A few days before the procedure: Follow a low-fiber diet. Avoid nuts, seeds, dried fruit, raw fruits, and vegetables. 1-3 days before the procedure: Eat only gelatin dessert or ice pops. Drink only clear liquids, such as water, clear juice, clear broth or bouillon, black coffee or tea, or clear soft drinks or sports drinks. Avoid liquids that contain red or purple dye. The day of the procedure: Do not eat solid foods. You may continue to drink clear liquids until up to 2 hours before the procedure. Do not eat or drink  anything starting 2 hours before the procedure, or within the time period that your health care provider recommends. Bowel prep If you were prescribed a bowel prep to take by mouth (orally) to clean out your colon: Take it as told by your health care provider. Starting the day before your procedure, you will need to drink a large amount of liquid medicine. The liquid will cause you to have many bowel  movements of loose stool until your stool becomes almost clear or light green. If your skin or the opening between the buttocks (anus) gets irritated from diarrhea, you may relieve the irritation using: Wipes with medicine in them, such as adult wet wipes with aloe and vitamin E. A product to soothe skin, such as petroleum jelly. If you vomit while drinking the bowel prep: Take a break for up to 60 minutes. Begin the bowel prep again. Call your health care provider if you keep vomiting or you cannot take the bowel prep without vomiting. To clean out your colon, you may also be given: Laxative medicines. These help you have a bowel movement. Instructions for enema use. An enema is liquid medicine injected into your rectum. Medicines Ask your health care provider about: Changing or stopping your regular medicines or supplements. This is especially important if you are taking iron supplements, diabetes medicines, or blood thinners. Taking medicines such as aspirin and ibuprofen. These medicines can thin your blood. Do not take these medicines unless your health care provider tells you to take them. Taking over-the-counter medicines, vitamins, herbs, and supplements. General instructions Ask your health care provider what steps will be taken to help prevent infection. These may include washing skin with a germ-killing soap. If you will be going home right after the procedure, plan to have a responsible adult: Take you home from the hospital or clinic. You will not be allowed to drive. Care for you for the time you are told. What happens during the procedure?  An IV will be inserted into one of your veins. You will be given a medicine to make you fall asleep (general anesthetic). You will lie on your side with your knees bent. A lubricant will be put on the tube. Then the tube will be: Inserted into your anus. Gently eased through all parts of your large intestine. Air will be sent into your  colon to keep it open. This may cause some pressure or cramping. Images will be taken with the camera and will appear on a screen. A small tissue sample may be removed to be looked at under a microscope (biopsy). The tissue may be sent to a lab for testing if any signs of problems are found. If small polyps are found, they may be removed and checked for cancer cells. When the procedure is finished, the tube will be removed. The procedure may vary among health care providers and hospitals. What happens after the procedure? Your blood pressure, heart rate, breathing rate, and blood oxygen level will be monitored until you leave the hospital or clinic. You may have a small amount of blood in your stool. You may pass gas and have mild cramping or bloating in your abdomen. This is caused by the air that was used to open your colon during the exam. If you were given a sedative during the procedure, it can affect you for several hours. Do not drive or operate machinery until your health care provider says that it is safe. It is up to you to get the  results of your procedure. Ask your health care provider, or the department that is doing the procedure, when your results will be ready. Summary A colonoscopy is a procedure to look at the entire large intestine. Follow instructions from your health care provider about eating and drinking before the procedure. If you were prescribed an oral bowel prep to clean out your colon, take it as told by your health care provider. During the colonoscopy, a flexible tube with a camera on its end is inserted into the anus and then passed into all parts of the large intestine. This information is not intended to replace advice given to you by your health care provider. Make sure you discuss any questions you have with your health care provider. Document Revised: 06/10/2022 Document Reviewed: 12/19/2020 Elsevier Patient Education  2024 [employer]  Monitored  Anesthesia Care Anesthesia refers to the techniques, procedures, and medicines that help a person stay safe and comfortable during surgery. Monitored anesthesia care, or sedation, is one type of anesthesia. You may have sedation if you do not need to be asleep for your procedure. Procedures that use sedation may include: Surgery to remove cataracts from your eyes. A dental procedure. A biopsy. This is when a tissue sample is removed and looked at under a microscope. You will be watched closely during your procedure. Your level of sedation or type of anesthesia may be changed to fit your needs. Tell a health care provider about: Any allergies you have. All medicines you are taking, including vitamins, herbs, eye drops, creams, and over-the-counter medicines. Any problems you or family members have had with anesthesia. Any bleeding problems you have. Any surgeries you have had. Any medical conditions or illnesses you have. This includes sleep apnea, cough, fever, or the flu. Whether you are pregnant or may be pregnant. Whether you use cigarettes, alcohol , or drugs. Any use of steroids, whether by mouth or as a cream. What are the risks? Your health care provider will talk with you about risks. These may include: Getting too much medicine (oversedation). Nausea. Allergic reactions to medicines. Trouble breathing. If this happens, a breathing tube may be used to help you breathe. It will be removed when you are awake and breathing on your own. Heart trouble. Lung trouble. Confusion that gets better with time (emergence delirium). What happens before the procedure? When to stop eating and drinking Follow instructions from your health care provider about what you may eat and drink. These may include: 8 hours before your procedure Stop eating most foods. Do not eat meat, fried foods, or fatty foods. Eat only light foods, such as toast or crackers. All liquids are okay except energy drinks and  alcohol . 6 hours before your procedure Stop eating. Drink only clear liquids, such as water, clear fruit juice, black coffee, plain tea, and sports drinks. Do not drink energy drinks or alcohol . 2 hours before your procedure Stop drinking all liquids. You may be allowed to take medicines with small sips of water. If you do not follow your health care provider's instructions, your procedure may be delayed or canceled. Medicines Ask your health care provider about: Changing or stopping your regular medicines. These include any diabetes medicines or blood thinners you take. Taking medicines such as aspirin and ibuprofen. These medicines can thin your blood. Do not take them unless your health care provider tells you to. Taking over-the-counter medicines, vitamins, herbs, and supplements. Testing You may have an exam or testing. You may have a blood or  urine sample taken. General instructions Do not use any products that contain nicotine or tobacco for at least 4 weeks before the procedure. These products include cigarettes, chewing tobacco, and vaping devices, such as e-cigarettes. If you need help quitting, ask your health care provider. If you will be going home right after the procedure, plan to have a responsible adult: Take you home from the hospital or clinic. You will not be allowed to drive. Care for you for the time you are told. What happens during the procedure?  Your blood pressure, heart rate, breathing, level of pain, and blood oxygen level will be monitored. An IV will be inserted into one of your veins. You may be given: A sedative. This helps you relax. Anesthesia. This will: Numb certain areas of your body. Make you fall asleep for surgery. You will be given medicines as needed to keep you comfortable. The more medicine you are given, the deeper your level of sedation will be. Your level of sedation may be changed to fit your needs. There are three levels of  sedation: Mild sedation. At this level, you may feel awake and relaxed. You will be able to follow directions. Moderate sedation. At this level, you will be sleepy. You may not remember the procedure. Deep sedation. At this level, you will be asleep. You will not remember the procedure. How you get the medicines will depend on your age and the procedure. They may be given as: A pill. This may be taken by mouth (orally) or inserted into the rectum. An injection. This may be into a vein or muscle. A spray through the nose. After your procedure is over, the medicine will be stopped. The procedure may vary among health care providers and hospitals. What happens after the procedure? Your blood pressure, heart rate, breathing rate, and blood oxygen level will be monitored until you leave the hospital or clinic. You may feel sleepy, clumsy, or nauseous. You may not remember what happened during or after the procedure. Sedation can affect you for several hours. Do not drive or use machinery until your health care provider says that it is safe. This information is not intended to replace advice given to you by your health care provider. Make sure you discuss any questions you have with your health care provider. Document Revised: 09/23/2021 Document Reviewed: 09/23/2021 Elsevier Patient Education  2024 [employer].

## 2024-01-29 ENCOUNTER — Ambulatory Visit (HOSPITAL_COMMUNITY)
Admission: RE | Admit: 2024-01-29 | Discharge: 2024-01-29 | Disposition: A | Discharge status: Home / Self Care | Attending: Gastroenterology | Admitting: Gastroenterology

## 2024-01-29 ENCOUNTER — Ambulatory Visit (HOSPITAL_COMMUNITY): Admitting: Anesthesiology

## 2024-01-29 ENCOUNTER — Encounter (HOSPITAL_COMMUNITY)
Admission: RE | Disposition: A | Discharge status: Home / Self Care | Payer: Self-pay | Source: Home / Self Care | Attending: Gastroenterology

## 2024-01-29 ENCOUNTER — Encounter (HOSPITAL_COMMUNITY): Payer: Self-pay | Admitting: Gastroenterology

## 2024-01-29 DIAGNOSIS — K219 Gastro-esophageal reflux disease without esophagitis: Secondary | ICD-10-CM | POA: Insufficient documentation

## 2024-01-29 DIAGNOSIS — K648 Other hemorrhoids: Secondary | ICD-10-CM | POA: Insufficient documentation

## 2024-01-29 DIAGNOSIS — K529 Noninfective gastroenteritis and colitis, unspecified: Secondary | ICD-10-CM | POA: Insufficient documentation

## 2024-01-29 DIAGNOSIS — R197 Diarrhea, unspecified: Secondary | ICD-10-CM | POA: Diagnosis not present

## 2024-01-29 HISTORY — PX: COLONOSCOPY: SHX5424

## 2024-01-29 LAB — HM COLONOSCOPY

## 2024-01-29 SURGERY — COLONOSCOPY
Anesthesia: General

## 2024-01-29 MED ORDER — LACTATED RINGERS IV SOLN: INTRAVENOUS | Status: DC

## 2024-01-29 MED ORDER — PROPOFOL 500 MG/50ML IV EMUL
INTRAVENOUS | Status: DC | PRN
  Administered 2024-01-29: 220 mg via INTRAVENOUS
  Administered 2024-01-29: 150 ug/kg/min via INTRAVENOUS

## 2024-01-29 MED ORDER — MIDAZOLAM HCL 2 MG/2ML IJ SOLN: 1.0000 mg | Freq: Once | INTRAMUSCULAR | Status: AC

## 2024-01-29 MED ORDER — GLYCOPYRROLATE PF 0.2 MG/ML IJ SOSY
PREFILLED_SYRINGE | INTRAMUSCULAR | Status: DC | PRN
  Administered 2024-01-29: .2 mg via INTRAVENOUS

## 2024-01-29 MED ORDER — MIDAZOLAM HCL 2 MG/2ML IJ SOLN
INTRAMUSCULAR | Status: AC
  Administered 2024-01-29: 1 mg via INTRAVENOUS
  Filled 2024-01-29: qty 2

## 2024-01-29 NOTE — Anesthesia Preprocedure Evaluation (Signed)
 Anesthesia Evaluation  Patient identified by MRN, date of birth, ID band Patient awake    Reviewed: Allergy & Precautions, H&P , NPO status , Patient's Chart, lab work & pertinent test results, reviewed documented beta blocker date and time   Airway Mallampati: II  TM Distance: >3 FB Neck ROM: full    Dental no notable dental hx.    Pulmonary neg pulmonary ROS   Pulmonary exam normal breath sounds clear to auscultation       Cardiovascular Exercise Tolerance: Good hypertension, negative cardio ROS  Rhythm:regular Rate:Normal     Neuro/Psych negative neurological ROS  negative psych ROS   GI/Hepatic Neg liver ROS,GERD  ,,  Endo/Other  negative endocrine ROS    Renal/GU negative Renal ROS  negative genitourinary   Musculoskeletal   Abdominal   Peds  Hematology negative hematology ROS (+)   Anesthesia Other Findings   Reproductive/Obstetrics negative OB ROS                             Anesthesia Physical Anesthesia Plan  ASA: 2  Anesthesia Plan: General   Post-op Pain Management:    Induction:   PONV Risk Score and Plan: Propofol infusion  Airway Management Planned:   Additional Equipment:   Intra-op Plan:   Post-operative Plan:   Informed Consent: I have reviewed the patients History and Physical, chart, labs and discussed the procedure including the risks, benefits and alternatives for the proposed anesthesia with the patient or authorized representative who has indicated his/her understanding and acceptance.     Dental Advisory Given  Plan Discussed with: CRNA  Anesthesia Plan Comments:        Anesthesia Quick Evaluation

## 2024-01-29 NOTE — Op Note (Signed)
 [facility] Patient Name: Luis Steele Procedure Date: 01/29/2024 7:23 AM MRN: [id number] Date of Birth: July 07, 1995 Attending MD: Toribio Fortune , , [id number] CSN: [id number] Age: 28 Admit Type: Outpatient Procedure:                Colonoscopy Indications:              Clinically significant diarrhea of unexplained                            origin Providers:                Toribio Fortune, Harlene Lips, Gordy Lonni Balm, Technician Referring MD:              Medicines:                Monitored Anesthesia Care Complications:            No immediate complications. Estimated Blood Loss:     Estimated blood loss: none. Procedure:                Pre-Anesthesia Assessment:                           - Prior to the procedure, a History and Physical                            was performed, and patient medications, allergies                            and sensitivities were reviewed. The patient's                            tolerance of previous anesthesia was reviewed.                           - The risks and benefits of the procedure and the                            sedation options and risks were discussed with the                            patient. All questions were answered and informed                            consent was obtained.                           - ASA Grade Assessment: I - A normal, healthy                            patient.                           After obtaining informed consent, the colonoscope  was passed under direct vision. Throughout the                            procedure, the patient's blood pressure, pulse, and                            oxygen saturations were monitored continuously. The                            [id number]-[id number] ([id number]) Peds Colon was introduced                            through the anus and advanced to the the terminal                            ileum. The  colonoscopy was performed without                            difficulty. The patient tolerated the procedure                            well. The quality of the bowel preparation was                            excellent. Scope In: 7:37:51 AM Scope Out: 7:54:18 AM Scope Withdrawal Time: 0 hours 10 minutes 28 seconds  Total Procedure Duration: 0 hours 16 minutes 27 seconds  Findings:      The perianal and digital rectal examinations were normal.      The terminal ileum appeared normal.      The colon (entire examined portion) appeared normal. Biopsies for       histology were taken with a cold forceps from the right colon and left       colon for evaluation of microscopic colitis.      Non-bleeding internal hemorrhoids were found during retroflexion. The       hemorrhoids were small. Impression:               - The examined portion of the ileum was normal.                           - The entire examined colon is normal. Biopsied.                           - Non-bleeding internal hemorrhoids. Moderate Sedation:      Per Anesthesia Care Recommendation:           - Discharge patient to home (ambulatory).                           - Resume previous diet.                           - Await pathology results.                           - Repeat colonoscopy at age 19 for  screening                            purposes - family history of colon polyps.                           - If normal biopsies, will give a 2 week course of                            Xifaxan for IBS-D.                           - Can take Imodium as needed up to 4 times a day                            for diarrhea. Procedure Code(s):        --- Professional ---                           [phone], Colonoscopy, flexible; with biopsy, single                            or multiple Diagnosis Code(s):        --- Professional ---                           K64.8, Other hemorrhoids                           R19.7, Diarrhea,  unspecified CPT copyright 2022 American Medical Association. All rights reserved. The codes documented in this report are preliminary and upon coder review may  be revised to meet current compliance requirements. Toribio Fortune, MD Toribio Fortune,  01/29/2024 8:03:49 AM This report has been signed electronically. Number of Addenda: 0

## 2024-01-29 NOTE — Transfer of Care (Signed)
 Immediate Anesthesia Transfer of Care Note  Patient: Luis Steele  Procedure(s) Performed: COLONOSCOPY  Patient Location: Endoscopy Unit  Anesthesia Type:General  Level of Consciousness: drowsy and patient cooperative  Airway & Oxygen Therapy: Patient Spontanous Breathing  Post-op Assessment: Report given to RN and Post -op Vital signs reviewed and stable  Post vital signs: Reviewed and stable  Last Vitals:  Vitals Value Taken Time  BP 83/38 01/29/24 07:59  Temp 36.6 C 01/29/24 07:59  Pulse 70 01/29/24 07:59  Resp 14 01/29/24 07:59  SpO2 100 % 01/29/24 07:59    Last Pain:  Vitals:   01/29/24 0759  TempSrc: Oral  PainSc: Asleep         Complications: No notable events documented.

## 2024-01-29 NOTE — Anesthesia Procedure Notes (Signed)
 Date/Time: 01/29/2024 7:31 AM  Performed by: Barbarann Verneita RAMAN, CRNAPre-anesthesia Checklist: Patient identified, Emergency Drugs available, Suction available, Timeout performed and Patient being monitored Patient Re-evaluated:Patient Re-evaluated prior to induction Oxygen Delivery Method: Nasal Cannula

## 2024-01-29 NOTE — Interval H&P Note (Signed)
 History and Physical Interval Note:  01/29/2024 7:28 AM  Luis Steele  has presented today for surgery, with the diagnosis of Chronic diarrhea.  The various methods of treatment have been discussed with the patient and family. After consideration of risks, benefits and other options for treatment, the patient has consented to  Procedure(s) with comments: COLONOSCOPY (N/A) - 8:15 AM, ASA 1-2 as a surgical intervention.  The patient's history has been reviewed, patient examined, no change in status, stable for surgery.  I have reviewed the patient's chart and labs.  Questions were answered to the patient's satisfaction.     Ayelet Gruenewald Castaneda Mayorga

## 2024-01-29 NOTE — Discharge Instructions (Signed)
 You are being discharged to home.  Resume your previous diet.  We are waiting for your pathology results.  Your physician has recommended a repeat colonoscopy at age 28 (please contact the clinic prior to your 47th birthday to schedule) for screening purposes.  Can take Imodium as needed up to 4 times a day for diarrhea.

## 2024-02-01 ENCOUNTER — Encounter (HOSPITAL_COMMUNITY): Payer: Self-pay | Admitting: Gastroenterology

## 2024-02-01 ENCOUNTER — Ambulatory Visit (INDEPENDENT_AMBULATORY_CARE_PROVIDER_SITE_OTHER): Payer: Self-pay | Admitting: Gastroenterology

## 2024-02-01 DIAGNOSIS — K580 Irritable bowel syndrome with diarrhea: Secondary | ICD-10-CM

## 2024-02-01 LAB — SURGICAL PATHOLOGY

## 2024-02-01 MED ORDER — RIFAXIMIN 550 MG PO TABS: 550.0000 mg | ORAL_TABLET | Freq: Three times a day (TID) | ORAL | 0 refills | Status: DC

## 2024-02-01 NOTE — Anesthesia Postprocedure Evaluation (Signed)
 Anesthesia Post Note  Patient: Luis Steele  Procedure(s) Performed: COLONOSCOPY  Patient location during evaluation: Phase II Anesthesia Type: General Level of consciousness: awake Pain management: pain level controlled Vital Signs Assessment: post-procedure vital signs reviewed and stable Respiratory status: spontaneous breathing and respiratory function stable Cardiovascular status: blood pressure returned to baseline and stable Postop Assessment: no headache and no apparent nausea or vomiting Anesthetic complications: no Comments: Late entry   No notable events documented.   Last Vitals:  Vitals:   01/29/24 0803 01/29/24 0815  BP: (!) 90/50 (!) 102/59  Pulse:    Resp:    Temp:    SpO2:      Last Pain:  Vitals:   01/29/24 0759  TempSrc: Oral  PainSc: Asleep                 Yvonna JINNY Bosworth

## 2024-02-02 ENCOUNTER — Encounter (INDEPENDENT_AMBULATORY_CARE_PROVIDER_SITE_OTHER): Payer: Self-pay | Admitting: *Deleted

## 2024-02-05 NOTE — Progress Notes (Signed)
 Patient result letter mailed procedure note and pathology result faxed to PCP

## 2024-02-08 ENCOUNTER — Telehealth (INDEPENDENT_AMBULATORY_CARE_PROVIDER_SITE_OTHER): Payer: Self-pay

## 2024-02-08 ENCOUNTER — Encounter (INDEPENDENT_AMBULATORY_CARE_PROVIDER_SITE_OTHER): Payer: Self-pay

## 2024-02-08 NOTE — Telephone Encounter (Signed)
 Per Patient cost of Xifaxan  is too expensive. Patient says the cost is $2,500.00. Do you want us  to reach out to him with the [facility] information? Please advise.

## 2024-02-08 NOTE — Telephone Encounter (Signed)
 Yes, this would be a great option, please provide to his this option. If he is agreeable, I can print the physical Rx. Thanks

## 2024-02-08 NOTE — Telephone Encounter (Signed)
 Luis Steele (Key: [id number]) Rx #: [id number] Xifaxan  550MG  tablets Form [employer] [place] [employer] Electronic Request Form Created 3 days ago Sent to Plan 6 hours ago Plan Response 6 hours ago Submit Clinical Questions 6 hours ago Determination Favorable 35 minutes ago Message from [employer] Approved. . Authorization Expiration Date: February 07, 2025.

## 2024-02-09 ENCOUNTER — Other Ambulatory Visit (INDEPENDENT_AMBULATORY_CARE_PROVIDER_SITE_OTHER): Payer: Self-pay

## 2024-02-09 DIAGNOSIS — K580 Irritable bowel syndrome with diarrhea: Secondary | ICD-10-CM

## 2024-02-09 MED ORDER — RIFAXIMIN 550 MG PO TABS: 550.0000 mg | ORAL_TABLET | Freq: Three times a day (TID) | ORAL | 0 refills | Status: DC

## 2024-02-09 NOTE — Telephone Encounter (Signed)
 Script on your desk.   I spoke with Luis Steele and made her aware we will have Dr. Merleen script for the [facility], and I would call her once it is sign by Doctor.

## 2024-02-09 NOTE — Telephone Encounter (Signed)
 Thanks

## 2024-02-09 NOTE — Telephone Encounter (Signed)
 I called the patient mother and spoke with her made her aware the script was written and ready to pick up from the front office. She is aware to call [facility] and ask them to supply her with the Xifaxan  550 mg tablets # 50, (I went over the process with her regarding calling the pharmacy and setting up payment and shipping details). She is aware the patient, once he gets the script to take one TID for 14 days # 42, she was made aware to discard the other 8 remaining tablets. The script was placed up from for them to pick up on 02/10/2024.

## 2024-02-24 ENCOUNTER — Encounter (INDEPENDENT_AMBULATORY_CARE_PROVIDER_SITE_OTHER): Payer: Self-pay | Admitting: Gastroenterology

## 2024-03-15 ENCOUNTER — Encounter (INDEPENDENT_AMBULATORY_CARE_PROVIDER_SITE_OTHER): Payer: Self-pay | Admitting: Gastroenterology

## 2024-03-15 ENCOUNTER — Ambulatory Visit (INDEPENDENT_AMBULATORY_CARE_PROVIDER_SITE_OTHER): Payer: BC Managed Care – PPO | Admitting: Gastroenterology

## 2024-03-15 VITALS — BP 119/63 | HR 94 | Temp 97.9°F | Ht 74.0 in | Wt 183.8 lb

## 2024-03-15 DIAGNOSIS — K580 Irritable bowel syndrome with diarrhea: Secondary | ICD-10-CM | POA: Diagnosis not present

## 2024-03-15 DIAGNOSIS — K219 Gastro-esophageal reflux disease without esophagitis: Secondary | ICD-10-CM | POA: Diagnosis not present

## 2024-03-15 NOTE — Patient Instructions (Signed)
 Continue omeprazole  40mg  daily for now, you can let us  know once you are close to running out and we can try omeprazole  20mg  daily Continue to avoid trigger foods Please make me aware if you have any new or recurring GI issues  Follow up 1 year  It was a pleasure to see you today. I want to create trusting relationships with patients and provide genuine, compassionate, and quality care. I truly value your feedback! please be on the lookout for a survey regarding your visit with me today. I appreciate your input about our visit and your time in completing this!    Queen Abbett L. Syann Cupples, MSN, APRN, AGNP-C Adult-Gerontology Nurse Practitioner [facility]enter[facility] at [facility]

## 2024-03-15 NOTE — Progress Notes (Addendum)
 Referring Provider: Job Bolt, PA Primary Care Physician:  Job Bolt, [place] Primary GI Physician: Dr. Eartha   Chief Complaint  Patient presents with   Follow-up    Pt arrives for follow up. States things are going pretty good at this time. No issues/concerns.    HPI:   Luis Steele is a 28 y.o. male with past medical history of GERD, IBS-D  Patient presenting today for:  GERD and IBS-D  Last seen in August, at that time having diarrhea, worse with certain foods, some scant blood when wiping. Taking omeprazole  40mg  daily for heartburn   Recommended checking CMP, Celiac panel, TSH, calprotectin, C diff, GI path, O&P, low fodmap diet, continue omeprazole  40mg  daily, consider decreasing to 20mg  daily  Labs on 8/21: CMP normal Celiac panel negative TSH 1.87  CRP <3 Calprotectin 35 GI path and O&P negative  Recommended colonoscopy for further evaluation  Colonoscopy 01/29/24: The examined portion of the ileum was normal.                           - The entire examined colon is normal. Biopsied.                           - Non-bleeding internal hemorrhoids. COLON, RANDOM, BIOPSY:  Colonic mucosa with no significant pathologic changes.  No microscopic colitis, active inflammation or granulomas.   Recommended 2 week course of xifaxan   Present:  Did do course of xifaxan  which seemed to help. He has 2-3 BMs per day that are more solid, no further issues with diarrhea unless he eats spicy foods which he tries to avoid. No abdominal pain, no further rectal bleeding or issues with hemorrhoids. GERD is well controlled on omeprazole  40mg  daily. No red flag symptoms. Patient denies melena, hematochezia, nausea, vomiting, diarrhea, constipation, dysphagia, odyonophagia, early satiety or weight loss.   Last Colonoscopy: 01/2024 normal, hemorrhoids Last Endoscopy: never    Filed Weights   03/15/24 1450  Weight: 183 lb 12.8 oz (83.4 kg)     Past Medical History:   Diagnosis Date   GERD (gastroesophageal reflux disease)     Past Surgical History:  Procedure Laterality Date   COLONOSCOPY N/A 01/29/2024   Procedure: COLONOSCOPY;  Surgeon: Eartha Angelia Sieving, MD;  Location: [facility];  Service: Gastroenterology;  Laterality: N/A;  8:15 AM, ASA 1-2   TYMPANOSTOMY TUBE PLACEMENT     and adenoids removed    Current Outpatient Medications  Medication Sig Dispense Refill   omeprazole  (PRILOSEC) 40 MG capsule Take 1 capsule (40 mg total) by mouth daily. 90 capsule 3   Peppermint Oil (IBGARD PO) Take by mouth daily at 6 (six) AM.     No current facility-administered medications for this visit.    Allergies as of 03/15/2024   (No Known Allergies)    Social History   Socioeconomic History   Marital status: Single    Spouse name: Not on file   Number of children: Not on file   Years of education: Not on file   Highest education level: Not on file  Occupational History   Not on file  Tobacco Use   Smoking status: Never   Smokeless tobacco: Never  Vaping Use   Vaping status: Never Used  Substance and Sexual Activity   Alcohol  use: Never   Drug use: Never   Sexual activity: Not on file  Other Topics Concern  Not on file  Social History Narrative   Not on file   Social Drivers of Health   Financial Resource Strain: Not on file  Food Insecurity: Not on file  Transportation Needs: Not on file  Physical Activity: Not on file  Stress: Not on file  Social Connections: Not on file    Review of systems General: negative for malaise, night sweats, fever, chills, weight loss Neck: Negative for lumps, goiter, pain and significant neck swelling Resp: Negative for cough, wheezing, dyspnea at rest CV: Negative for chest pain, leg swelling, palpitations, orthopnea GI: denies melena, hematochezia, nausea, vomiting, diarrhea, constipation, dysphagia, odyonophagia, early satiety or unintentional weight loss.  MSK: Negative for joint  pain or swelling, back pain, and muscle pain. Derm: Negative for itching or rash Psych: Denies depression, anxiety, memory loss, confusion. No homicidal or suicidal ideation.  Heme: Negative for prolonged bleeding, bruising easily, and swollen nodes. Endocrine: Negative for cold or heat intolerance, polyuria, polydipsia and goiter. Neuro: negative for tremor, gait imbalance, syncope and seizures. The remainder of the review of systems is noncontributory.  Physical Exam: BP 119/63   Pulse 94   Temp 97.9 F (36.6 C)   Ht 6' 2 (1.88 m)   Wt 183 lb 12.8 oz (83.4 kg)   BMI 23.60 kg/m  General:   Alert and oriented. No distress noted. Pleasant and cooperative.  Head:  Normocephalic and atraumatic. Eyes:  Conjuctiva clear without scleral icterus. Mouth:  Oral mucosa pink and moist. Good dentition. No lesions. Heart: Normal rate and rhythm, s1 and s2 heart sounds present.  Lungs: Clear lung sounds in all lobes. Respirations equal and unlabored. Abdomen:  +BS, soft, non-tender and non-distended. No rebound or guarding. No HSM or masses noted. Derm: No palmar erythema or jaundice Msk:  Symmetrical without gross deformities. Normal posture. Extremities:  Without edema. Neurologic:  Alert and  oriented x4 Psych:  Alert and cooperative. Normal mood and affect.  Invalid input(s): 6 MONTHS   ASSESSMENT: Luis Steele is a 28 y.o. male presenting today for follow up of GERD and IBS-D  GERD: well controlled on omeprazole  40mg  daily. Denies any breakthrough. He will let us  know closer to time of needing a refill and we can drop down to 20mg  daily since symptoms are so well controlled.   IBS-D: recent labs as above to include infectious stool testing, celiac panel, TSH and inflammatory markers which were all negative/WNL. Colonoscopy with normal colonic biopsies, only finding of hemorrhoids. Treated with 2 week course of xifaxan  for suspected IBS-D flare with improvement in symptoms. Now having  2-3 solid stools per day without abdominal pain or diarrhea. Recommend continue to avoid trigger foods. He will make me aware of any recurrence of symptoms.    PLAN:  -continue omeprazole  40mg  daily, will try dropping down to 20mg  at time of next refill -continue to avoid trigger foods -pt to make me aware of recurrence of diarrhea  All questions were answered, patient verbalized understanding and is in agreement with plan as outlined above.   Follow Up: 1 year   Luis Arakelian L. Mariette, MSN, APRN, AGNP-C Adult-Gerontology Nurse Practitioner [facility]  I have reviewed the note and agree with the APP's assessment as described in this progress note  Toribio Fortune, MD Gastroenterology and Hepatology [facility]enter[facility]

## 2024-04-07 ENCOUNTER — Encounter (INDEPENDENT_AMBULATORY_CARE_PROVIDER_SITE_OTHER): Payer: Self-pay | Admitting: Gastroenterology

## 2024-04-08 ENCOUNTER — Other Ambulatory Visit (INDEPENDENT_AMBULATORY_CARE_PROVIDER_SITE_OTHER): Payer: Self-pay | Admitting: Gastroenterology

## 2024-04-08 DIAGNOSIS — K219 Gastro-esophageal reflux disease without esophagitis: Secondary | ICD-10-CM

## 2024-04-08 DIAGNOSIS — K529 Noninfective gastroenteritis and colitis, unspecified: Secondary | ICD-10-CM

## 2024-04-08 MED ORDER — COLESTIPOL HCL 1 G PO TABS: 2.0000 g | ORAL_TABLET | Freq: Every day | ORAL | 3 refills | Status: AC

## 2024-04-08 MED ORDER — OMEPRAZOLE 20 MG PO CPDR: 20.0000 mg | DELAYED_RELEASE_CAPSULE | Freq: Every day | ORAL | 3 refills | Status: AC

## 2024-04-08 NOTE — Addendum Note (Signed)
 Addended by: CASTANEDA MAYORGA, Jakalyn Kratky on: 04/08/2024 09:26 AM   Modules accepted: Orders
